# Patient Record
Sex: Female | Born: 1965 | Race: White | Hispanic: No | Marital: Married | Smoking: Never smoker
Health system: Southern US, Community
[De-identification: ages and names within clinical notes are randomized; demographics above are authoritative.]

## PROBLEM LIST (undated history)

## (undated) DIAGNOSIS — I10 Essential (primary) hypertension: Secondary | ICD-10-CM

## (undated) HISTORY — PX: ANKLE SURGERY: SHX546

## (undated) HISTORY — PX: DILATION AND CURETTAGE OF UTERUS: SHX78

---

## 2003-11-22 ENCOUNTER — Other Ambulatory Visit: Payer: Self-pay

## 2009-09-07 ENCOUNTER — Ambulatory Visit: Payer: Self-pay | Admitting: Obstetrics and Gynecology

## 2009-10-17 ENCOUNTER — Ambulatory Visit: Payer: Self-pay | Admitting: Obstetrics and Gynecology

## 2010-11-23 ENCOUNTER — Emergency Department: Payer: Self-pay | Admitting: Unknown Physician Specialty

## 2012-01-10 ENCOUNTER — Emergency Department: Payer: Self-pay | Admitting: *Deleted

## 2012-01-10 ENCOUNTER — Ambulatory Visit: Payer: Self-pay | Admitting: Medical

## 2012-04-14 ENCOUNTER — Ambulatory Visit: Payer: Self-pay | Admitting: Internal Medicine

## 2012-04-16 LAB — HM MAMMOGRAPHY: HM Mammogram: NORMAL

## 2013-09-07 ENCOUNTER — Ambulatory Visit: Payer: Self-pay | Admitting: Family Medicine

## 2014-07-10 LAB — HM PAP SMEAR: HM Pap smear: NEGATIVE

## 2014-07-10 LAB — LIPID PANEL
Cholesterol: 201 mg/dL — AB (ref 0–200)
HDL: 59 mg/dL (ref 35–70)
LDL CALC: 114 mg/dL
TRIGLYCERIDES: 140 mg/dL (ref 40–160)

## 2014-07-10 LAB — TSH: TSH: 1.1 u[IU]/mL (ref <=5.90)

## 2014-12-17 ENCOUNTER — Emergency Department
Admission: EM | Admit: 2014-12-17 | Discharge: 2014-12-17 | Disposition: A | Discharge status: Home / Self Care | Payer: BLUE CROSS/BLUE SHIELD | Attending: Emergency Medicine | Admitting: Emergency Medicine

## 2014-12-17 ENCOUNTER — Emergency Department: Payer: BLUE CROSS/BLUE SHIELD

## 2014-12-17 ENCOUNTER — Other Ambulatory Visit: Payer: Self-pay

## 2014-12-17 ENCOUNTER — Encounter: Payer: Self-pay | Admitting: Emergency Medicine

## 2014-12-17 DIAGNOSIS — Z79899 Other long term (current) drug therapy: Secondary | ICD-10-CM | POA: Diagnosis not present

## 2014-12-17 DIAGNOSIS — M6281 Muscle weakness (generalized): Secondary | ICD-10-CM | POA: Diagnosis present

## 2014-12-17 DIAGNOSIS — R630 Anorexia: Secondary | ICD-10-CM | POA: Insufficient documentation

## 2014-12-17 DIAGNOSIS — I10 Essential (primary) hypertension: Secondary | ICD-10-CM | POA: Insufficient documentation

## 2014-12-17 DIAGNOSIS — R55 Syncope and collapse: Secondary | ICD-10-CM | POA: Insufficient documentation

## 2014-12-17 DIAGNOSIS — R110 Nausea: Secondary | ICD-10-CM | POA: Diagnosis not present

## 2014-12-17 HISTORY — DX: Essential (primary) hypertension: I10

## 2014-12-17 LAB — CBC
HEMATOCRIT: 40.9 % (ref 35.0–47.0)
HEMOGLOBIN: 13.4 g/dL (ref 12.0–16.0)
MCH: 31.1 pg (ref 26.0–34.0)
MCHC: 32.8 g/dL (ref 32.0–36.0)
MCV: 94.7 fL (ref 80.0–100.0)
PLATELETS: 291 10*3/uL (ref 150–440)
RBC: 4.31 MIL/uL (ref 3.80–5.20)
RDW: 13.9 % (ref 11.5–14.5)
WBC: 7.9 10*3/uL (ref 3.6–11.0)

## 2014-12-17 LAB — URINALYSIS COMPLETE WITH MICROSCOPIC (ARMC ONLY)
Bacteria, UA: NONE SEEN
Bilirubin Urine: NEGATIVE
GLUCOSE, UA: NEGATIVE mg/dL
HGB URINE DIPSTICK: NEGATIVE
KETONES UR: NEGATIVE mg/dL
Leukocytes, UA: NEGATIVE
NITRITE: NEGATIVE
PROTEIN: NEGATIVE mg/dL
Specific Gravity, Urine: 1.005 (ref 1.005–1.030)
pH: 7 (ref 5.0–8.0)

## 2014-12-17 LAB — BASIC METABOLIC PANEL
Anion gap: 7 (ref 5–15)
BUN: 18 mg/dL (ref 6–20)
CO2: 25 mmol/L (ref 22–32)
Calcium: 8.8 mg/dL — ABNORMAL LOW (ref 8.9–10.3)
Chloride: 106 mmol/L (ref 101–111)
Creatinine, Ser: 0.74 mg/dL (ref 0.44–1.00)
Glucose, Bld: 100 mg/dL — ABNORMAL HIGH (ref 65–99)
POTASSIUM: 3.4 mmol/L — AB (ref 3.5–5.1)
Sodium: 138 mmol/L (ref 135–145)

## 2014-12-17 LAB — TROPONIN I: Troponin I: <0.03 ng/mL (ref <0.031)

## 2014-12-17 NOTE — ED Notes (Signed)
Patient states she has no pain but has numbness and tingling in the left arm.

## 2014-12-17 NOTE — ED Notes (Signed)
HR listed incorrect.  Patient maintaining normal HR.

## 2014-12-17 NOTE — ED Notes (Signed)
MD at bedside during triage

## 2014-12-17 NOTE — ED Provider Notes (Signed)
[facility] Emergency Department Provider Note   ____________________________________________  Time seen: 9:15 AM I have reviewed the triage vital signs and the triage nursing note.  HISTORY  Chief Complaint Weakness   Historian Patient  HPI Syndey Holder is a 49 y.o. female who felt generalized weakness this morning and wash was driving started to fell lightheaded and dizzy like she might pass out. She had no palpitations, no chest pain. She was nauseated and sweating at that episode. She feels somewhat better upon arrival here to the emergency department. She does still feel generalized weakness. Symptoms are considered moderate. She's never felt like this before. She does take losartan for blood pressure. She's not been sick or ill recently.    Past Medical History  Diagnosis Date  . Hypertension     There are no active problems to display for this patient.   Past Surgical History  Procedure Laterality Date  . Ankle surgery    . Dilation and curettage of uterus      Current Outpatient Rx  Name  Route  Sig  Dispense  Refill  . acetaminophen (TYLENOL) 500 MG tablet   Oral   Take 1,000 mg by mouth every 6 (six) hours as needed for mild pain, moderate pain or headache.         . albuterol (PROVENTIL HFA;VENTOLIN HFA) 108 (90 BASE) MCG/ACT inhaler   Inhalation   Inhale 2 puffs into the lungs every 6 (six) hours as needed for wheezing or shortness of breath.         Marland Kitchen aspirin-acetaminophen-caffeine (EXCEDRIN MIGRAINE) 250-250-65 MG per tablet   Oral   Take 2 tablets by mouth every 6 (six) hours as needed for headache or migraine.         Marland Kitchen losartan (COZAAR) 100 MG tablet   Oral   Take 100 mg by mouth daily.      12     Allergies Review of patient's allergies indicates no known allergies.  No family history on file. history of "heart problems "  Social History History  Substance Use Topics  . Smoking status: Never Smoker   .  Smokeless tobacco: Not on file  . Alcohol Use: No    Review of Systems  Constitutional: Negative for fever. Eyes: Negative for visual changes. ENT: Negative for sore throat. Cardiovascular: Negative for chest pain. Respiratory: Negative for shortness of breath. Gastrointestinal: Negative for abdominal pain, vomiting and diarrhea. Genitourinary: Negative for dysuria. Musculoskeletal: Negative for back pain. Skin: Negative for rash. Neurological: Negative for headaches, focal weakness or numbness.  ____________________________________________   PHYSICAL EXAM:  VITAL SIGNS: ED Triage Vitals  Enc Vitals Group     BP 12/17/14 0833 175/89 mmHg     Pulse Rate 12/17/14 0833 66     Resp 12/17/14 0833 16     Temp 12/17/14 0833 97.9 F (36.6 C)     Temp Source 12/17/14 0833 Oral     SpO2 12/17/14 0833 99 %     Weight 12/17/14 0833 215 lb (97.523 kg)     Height 12/17/14 0833 5\' 5"  (1.651 m)     Head Cir --      Peak Flow --      Pain Score --      Pain Loc --      Pain Edu? --      Excl. in GC? --      Constitutional: Alert and oriented. Slightly pale. Well appearing and in no distress. Eyes:  Conjunctivae are normal. PERRL. Normal extraocular movements. ENT   Head: Normocephalic and atraumatic.   Nose: No congestion/rhinnorhea.   Mouth/Throat: Mucous membranes are moist.   Neck: No stridor. Cardiovascular: Normal rate, regular rhythm.  No murmurs, rubs, or gallops. Respiratory: Normal respiratory effort without tachypnea nor retractions. Breath sounds are clear and equal bilaterally. No wheezes/rales/rhonchi. Gastrointestinal: Soft. No distention, no guarding, no rebound. Nontender  Genitourinary/rectal: Deferred Musculoskeletal: Nontender with normal range of motion in all extremities. No joint effusions.  No lower extremity tenderness nor edema. Neurologic:  Normal speech and language. No gross focal neurologic deficits are appreciated. Skin:  Skin is warm,  dry and intact. No rash noted. Psychiatric: Mood and affect are normal. Speech and behavior are normal. Patient exhibits appropriate insight and judgment.  ____________________________________________   EKG  I, Governor Rooks, MD, the attending physician have personally viewed and interpreted this ECG.   67 bpm. Normal sinus rhythm. Narrow QRS. Normal axis. Nonspecific T wave. ____________________________________________  LABS (pertinent positives/negatives)  White blood cell count normal at 7.9 and hemoglobin 13.4 Metabolic panel without significant abnormality Troponin less than 0.03 Urinalysis negative  ____________________________________________  RADIOLOGY Radiologist results reviewed  Chest x-ray: Negative __________________________________________  PROCEDURES  Procedure(s) performed: None Critical Care performed: None  ____________________________________________   ED COURSE / ASSESSMENT AND PLAN  Pertinent labs & imaging results that were available during my care of the patient were reviewed by me and considered in my medical decision making (see chart for details).  Patient an episode of nausea, dizziness, sweating which sounds like a presyncopal event. There was no chest pain or palpitations. She's not been sick or ill recently. She has not had return of any symptoms. Her EKG and evaluation are reassuring. I suspect this may have been a possible on vasovagal presyncopal episode. I discussed with them that this is what I think is going on and that her evaluation is reassuring. After repeat troponin to 3 hour, negative, patient is discharged with follow-up instructions and return precautions. I discussed with her following up with the cardiologist for the consideration of echocardiogram and or Holter monitor.   ___________________________________________   FINAL CLINICAL IMPRESSION(S) / ED DIAGNOSES   Final diagnoses:  Near syncope      Governor Rooks,  MD 12/17/14 1114

## 2014-12-17 NOTE — ED Notes (Addendum)
Patient came in by EMS- c/o waking up light headed and dizzy and felt like she was going to pass out.  When she got to work she called EMS.  Per EMS Patient hypertensive the entire drive over.  CBG 90.

## 2014-12-17 NOTE — Discharge Instructions (Signed)
No certain cause was found for your episode of near passing out, but her exam and evaluation are reassuring. Return to the emergency department for any new or worsening condition including chest pain, passing out, trouble breathing, one-sided weakness or numbness, trouble with speech, fever, or any other symptoms concerning to you. We discussed follow-up with a physician within 2 days, either cardiologist or your primary care doctor.  Near-Syncope Near-syncope (commonly known as near fainting) is sudden weakness, dizziness, or feeling like you might pass out. During an episode of near-syncope, you may also develop pale skin, have tunnel vision, or feel sick to your stomach (nauseous). Near-syncope may occur when getting up after sitting or while standing for a long time. It is caused by a sudden decrease in blood flow to the brain. This decrease can result from various causes or triggers, most of which are not serious. However, because near-syncope can sometimes be a sign of something serious, a medical evaluation is required. The specific cause is often not determined. HOME CARE INSTRUCTIONS  Monitor your condition for any changes. The following actions may help to alleviate any discomfort you are experiencing:  Have someone stay with you until you feel stable.  Lie down right away and prop your feet up if you start feeling like you might faint. Breathe deeply and steadily. Wait until all the symptoms have passed. Most of these episodes last only a few minutes. You may feel tired for several hours.   Drink enough fluids to keep your urine clear or pale yellow.   If you are taking blood pressure or heart medicine, get up slowly when seated or lying down. Take several minutes to sit and then stand. This can reduce dizziness.  Follow up with your health care provider as directed. SEEK IMMEDIATE MEDICAL CARE IF:   You have a severe headache.   You have unusual pain in the chest, abdomen, or  back.   You are bleeding from the mouth or rectum, or you have black or tarry stool.   You have an irregular or very fast heartbeat.   You have repeated fainting or have seizure-like jerking during an episode.   You faint when sitting or lying down.   You have confusion.   You have difficulty walking.   You have severe weakness.   You have vision problems.  MAKE SURE YOU:   Understand these instructions.  Will watch your condition.  Will get help right away if you are not doing well or get worse. Document Released: 06/23/2005 Document Revised: 06/28/2013 Document Reviewed: 11/26/2012 [facility] Patient Information 2015 [place], [place]. This information is not intended to replace advice given to you by your health care provider. Make sure you discuss any questions you have with your health care provider.

## 2015-05-02 ENCOUNTER — Encounter: Payer: Self-pay | Admitting: Internal Medicine

## 2015-05-02 ENCOUNTER — Other Ambulatory Visit: Payer: Self-pay | Admitting: Internal Medicine

## 2015-05-02 DIAGNOSIS — G43019 Migraine without aura, intractable, without status migrainosus: Secondary | ICD-10-CM | POA: Insufficient documentation

## 2015-05-02 DIAGNOSIS — I10 Essential (primary) hypertension: Secondary | ICD-10-CM | POA: Insufficient documentation

## 2015-05-02 DIAGNOSIS — E782 Mixed hyperlipidemia: Secondary | ICD-10-CM | POA: Insufficient documentation

## 2015-05-02 DIAGNOSIS — J4520 Mild intermittent asthma, uncomplicated: Secondary | ICD-10-CM | POA: Insufficient documentation

## 2015-05-02 DIAGNOSIS — M5412 Radiculopathy, cervical region: Secondary | ICD-10-CM | POA: Insufficient documentation

## 2015-05-02 DIAGNOSIS — H9319 Tinnitus, unspecified ear: Secondary | ICD-10-CM | POA: Insufficient documentation

## 2015-05-02 DIAGNOSIS — R87610 Atypical squamous cells of undetermined significance on cytologic smear of cervix (ASC-US): Secondary | ICD-10-CM | POA: Insufficient documentation

## 2015-11-16 ENCOUNTER — Other Ambulatory Visit: Payer: Self-pay | Admitting: Internal Medicine

## 2015-12-05 NOTE — Telephone Encounter (Signed)
Left for pt to call the office pt still has not return any of my calls.

## 2016-06-09 ENCOUNTER — Other Ambulatory Visit: Payer: Self-pay | Admitting: Pediatrics

## 2016-06-09 DIAGNOSIS — Z1231 Encounter for screening mammogram for malignant neoplasm of breast: Secondary | ICD-10-CM

## 2016-07-08 ENCOUNTER — Ambulatory Visit
Admission: RE | Admit: 2016-07-08 | Discharge: 2016-07-08 | Disposition: A | Discharge status: Home / Self Care | Payer: BLUE CROSS/BLUE SHIELD | Source: Ambulatory Visit | Attending: Pediatrics | Admitting: Pediatrics

## 2016-07-08 DIAGNOSIS — Z1231 Encounter for screening mammogram for malignant neoplasm of breast: Secondary | ICD-10-CM | POA: Insufficient documentation

## 2016-07-14 ENCOUNTER — Other Ambulatory Visit: Payer: Self-pay | Admitting: Pediatrics

## 2016-07-14 DIAGNOSIS — R921 Mammographic calcification found on diagnostic imaging of breast: Secondary | ICD-10-CM

## 2016-07-14 DIAGNOSIS — R928 Other abnormal and inconclusive findings on diagnostic imaging of breast: Secondary | ICD-10-CM

## 2016-07-30 ENCOUNTER — Other Ambulatory Visit: Payer: Self-pay | Admitting: Pediatrics

## 2016-07-30 ENCOUNTER — Ambulatory Visit
Admission: RE | Admit: 2016-07-30 | Discharge: 2016-07-30 | Disposition: A | Discharge status: Home / Self Care | Payer: BLUE CROSS/BLUE SHIELD | Source: Ambulatory Visit | Attending: Pediatrics | Admitting: Pediatrics

## 2016-07-30 DIAGNOSIS — R928 Other abnormal and inconclusive findings on diagnostic imaging of breast: Secondary | ICD-10-CM

## 2016-07-30 DIAGNOSIS — R921 Mammographic calcification found on diagnostic imaging of breast: Secondary | ICD-10-CM | POA: Insufficient documentation

## 2016-08-05 ENCOUNTER — Other Ambulatory Visit: Payer: Self-pay | Admitting: Pediatrics

## 2016-08-05 DIAGNOSIS — R921 Mammographic calcification found on diagnostic imaging of breast: Secondary | ICD-10-CM

## 2016-08-05 DIAGNOSIS — R928 Other abnormal and inconclusive findings on diagnostic imaging of breast: Secondary | ICD-10-CM

## 2016-08-27 ENCOUNTER — Ambulatory Visit
Admission: RE | Admit: 2016-08-27 | Discharge: 2016-08-27 | Disposition: A | Discharge status: Home / Self Care | Payer: BLUE CROSS/BLUE SHIELD | Source: Ambulatory Visit | Attending: Pediatrics | Admitting: Pediatrics

## 2016-08-27 DIAGNOSIS — R928 Other abnormal and inconclusive findings on diagnostic imaging of breast: Secondary | ICD-10-CM

## 2016-08-27 DIAGNOSIS — R921 Mammographic calcification found on diagnostic imaging of breast: Secondary | ICD-10-CM

## 2017-01-04 ENCOUNTER — Ambulatory Visit
Admission: EM | Admit: 2017-01-04 | Discharge: 2017-01-04 | Disposition: A | Discharge status: Home / Self Care | Payer: Worker's Compensation | Attending: Family Medicine | Admitting: Family Medicine

## 2017-01-04 DIAGNOSIS — S63642A Sprain of metacarpophalangeal joint of left thumb, initial encounter: Secondary | ICD-10-CM | POA: Diagnosis not present

## 2017-01-04 MED ORDER — IBUPROFEN 600 MG PO TABS: 600.0000 mg | ORAL_TABLET | Freq: Four times a day (QID) | ORAL | 0 refills | Status: AC | PRN

## 2017-01-04 NOTE — ED Triage Notes (Signed)
Patient complains of left hand pain that started yesterday after trying to turn over a bucket of sheet rock mud. Patient states that she felt a pop at the base of her thumb and has noticed pain and swelling. Patient states that this occurred around 3pm yesterday. Patient noticed today she was unable to grasp a dollar bill.

## 2017-01-04 NOTE — ED Provider Notes (Signed)
CSN: [id number]     Arrival date & time 01/04/17  1426 History   First MD Initiated Contact with Patient 01/04/17 1504     Chief Complaint  Patient presents with  . Work Related Injury  . Hand Injury    Left    (Consider location/radiation/quality/duration/timing/severity/associated sxs/prior Treatment) HPI  51 year old female presents to the clinic for evaluation of a Worker's Comp. injury. Patient works at [employer] home [employer]. She is right-hand dominant. She states yesterday, she was working at [employer] when she was twisting a bucket of sheet rock putty with the handle of the bucket in her hand. As she was rotating the bucket, she felt a pull to the thenar eminence of her left thumb. She denies hearing or feeling a pop. Her pain was immediately present, sharp, moderate. Pain has improved now, currently a 6/10. She has taken some Tylenol. She denies any catching triggering her locking of the left thumb. No numbness or tingling.  Past Medical History:  Diagnosis Date  . Hypertension    Past Surgical History:  Procedure Laterality Date  . ANKLE SURGERY    . DILATION AND CURETTAGE OF UTERUS     Family History  Problem Relation Age of Onset  . Adopted: Yes  . Breast cancer Neg Hx    Social History  Substance Use Topics  . Smoking status: Never Smoker  . Smokeless tobacco: Never Used  . Alcohol use No   OB History    No data available     Review of Systems  Constitutional: Negative for fever.  Musculoskeletal: Positive for arthralgias. Negative for gait problem, joint swelling, myalgias and neck stiffness.  Neurological: Negative for weakness and numbness.    Allergies  Patient has no known allergies.  Home Medications   Prior to Admission medications   Medication Sig Start Date End Date Taking? Authorizing Provider  acetaminophen (TYLENOL) 500 MG tablet Take 1,000 mg by mouth every 6 (six) hours as needed for mild pain, moderate pain or headache.   Yes  [provider]  albuterol (PROVENTIL HFA;VENTOLIN HFA) 108 (90 BASE) MCG/ACT inhaler Inhale 2 puffs into the lungs every 6 (six) hours as needed for wheezing or shortness of breath.   Yes [provider]  aspirin 81 MG tablet Take 1 tablet by mouth daily.   Yes [provider]  aspirin-acetaminophen-caffeine (EXCEDRIN MIGRAINE) [phone] MG per tablet Take 2 tablets by mouth every 6 (six) hours as needed for headache or migraine.   Yes [provider]  losartan (COZAAR) 100 MG tablet TAKE 1 TABLET BY MOUTH DAILY. 11/16/15  Yes Reubin Milan, MD  ibuprofen (ADVIL,MOTRIN) 600 MG tablet Take 1 tablet (600 mg total) by mouth every 6 (six) hours as needed for moderate pain. 01/04/17   Evon Slack, PA-C   Meds Ordered and Administered this Visit  Medications - No data to display  BP (!) 153/84 (BP Location: Left Arm)   Pulse 81   Temp 98.2 F (36.8 C) (Oral)   Resp 16   Ht 5\' 4"  (1.626 m)   Wt 215 lb (97.5 kg)   SpO2 100%   BMI 36.90 kg/m  No data found.   Physical Exam  Constitutional: She is oriented to person, place, and time. She appears well-developed and well-nourished.  HENT:  Head: Normocephalic and atraumatic.  Eyes: Conjunctivae are normal.  Neck: Normal range of motion.  Cardiovascular: Normal rate.   Musculoskeletal:  Examination of the left hand shows patient  has full composite fist. She is minimally tender to palpation along the thenar eminence of the left thumb. Strength is within normal limits, no weakness with opposition. She has a slightly positive since he granted this. She is nontender along the lumbar pulling with no catching triggering locking with active or passive range of motion of the left thumb. Thumb is stable to valgus and varus stress testing along the collateral ligaments. No pain with stress testing. She has a minimally positive Finkelstein's test but no significant tenderness over the radial styloid. There is no  swelling warmth or erythema noted. Sensation is intact distally. Negative Tinel's and Phalen's sign.  Neurological: She is alert and oriented to person, place, and time.    Urgent Care Course     Procedures (including critical care time)   Labs Review Labs Reviewed - No data to display  Imaging Review No results found.   Visual Acuity Review  Right Eye Distance:   Left Eye Distance:   Bilateral Distance:    Right Eye Near:   Left Eye Near:    Bilateral Near:         MDM   1. Sprain of metacarpophalangeal (MCP) joint of left thumb, initial encounter    51 year old female with mild strain to the left thumb thenar muscle. 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 No evidence of dislocation/fracture/deformity. She is placed into thumb spica splint. She'll start ibuprofen 600 mg 3 times a day with food. She can return to work with light duty restrictions consisting of no pushing pulling or lifting greater than 20 pounds. Left upper extremity, muscle or splint were. She'll follow-up with orthopedics.   Evon SlackGaines, Thomas C, PA-C 01/04/17 1524

## 2017-01-04 NOTE — Discharge Instructions (Signed)
Please take ibuprofen and Tylenol as needed for pain. Wear thumb spica splint for the next week until you can follow-up with orthopedics. Avoid any lifting pushing or pulling with the left hand.

## 2018-06-27 IMAGING — MG MM DIGITAL DIAGNOSTIC UNILAT*R* W/ TOMO W/ CAD
8 of 10 series · 8 of 18 positions shown · non-contrast
Comparison: Previous exam(s).

ADDENDUM:
The patient returned for attempt at stereotactic guided biopsy of
the calcifications seen within the right breast on recent diagnostic
mammogram. On the CC scout tomosynthesis view obtained for
targeting, the calcifications are seen to be definitively within a
blood vessel wall consistent with vascular calcifications. No
further workup is needed. The patient can return to annual screening
mammography in July 2017.
CLINICAL DATA: Callback from screening mammogram

EXAM:
2D DIGITAL DIAGNOSTIC UNILATERAL RIGHT MAMMOGRAM WITH CAD AND
ADJUNCT TOMO

[R ML (1 of 4)]
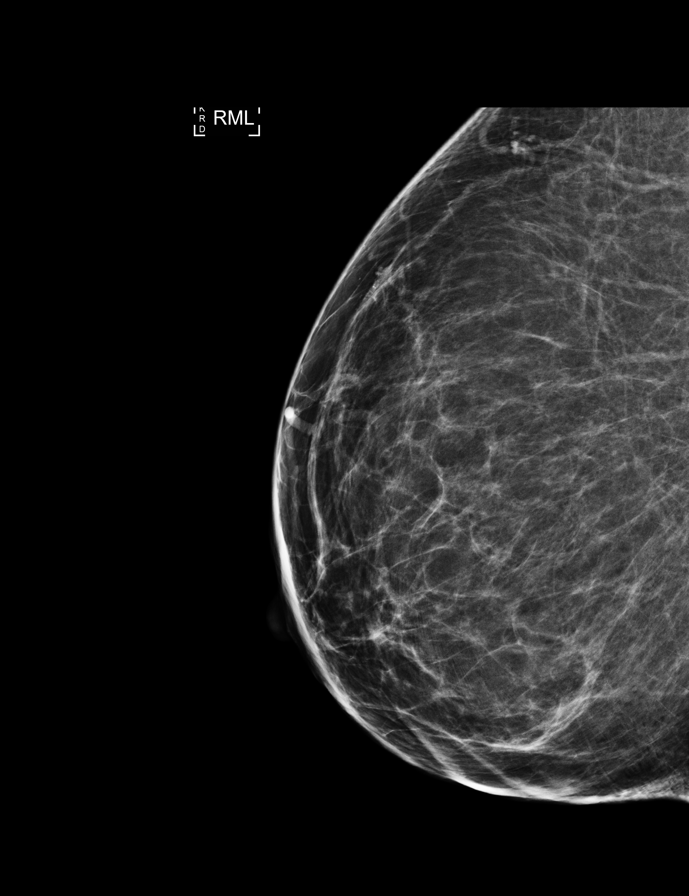

[R CC (1 of 2)]
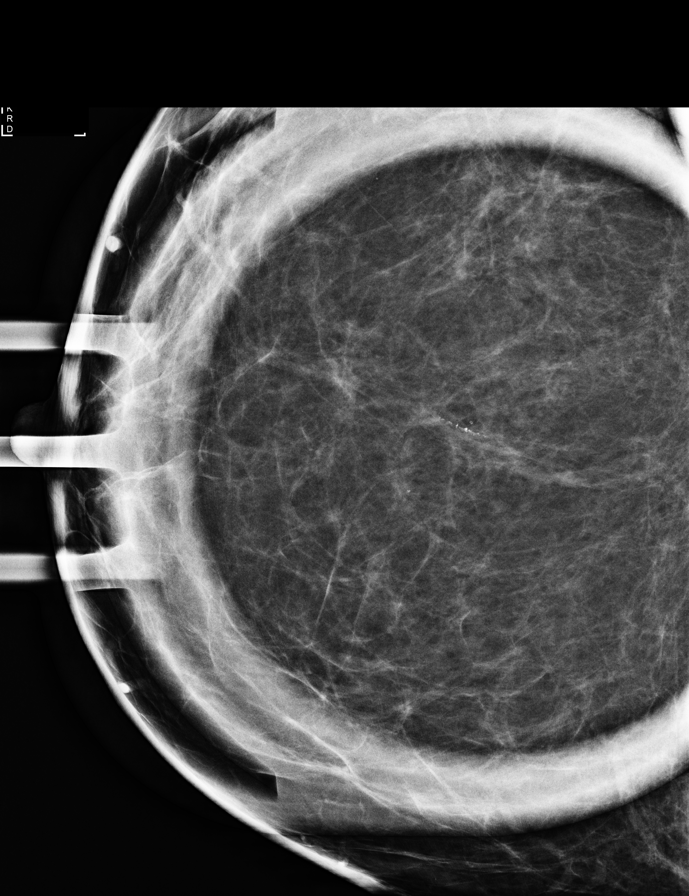

[R ML (2 of 4)]
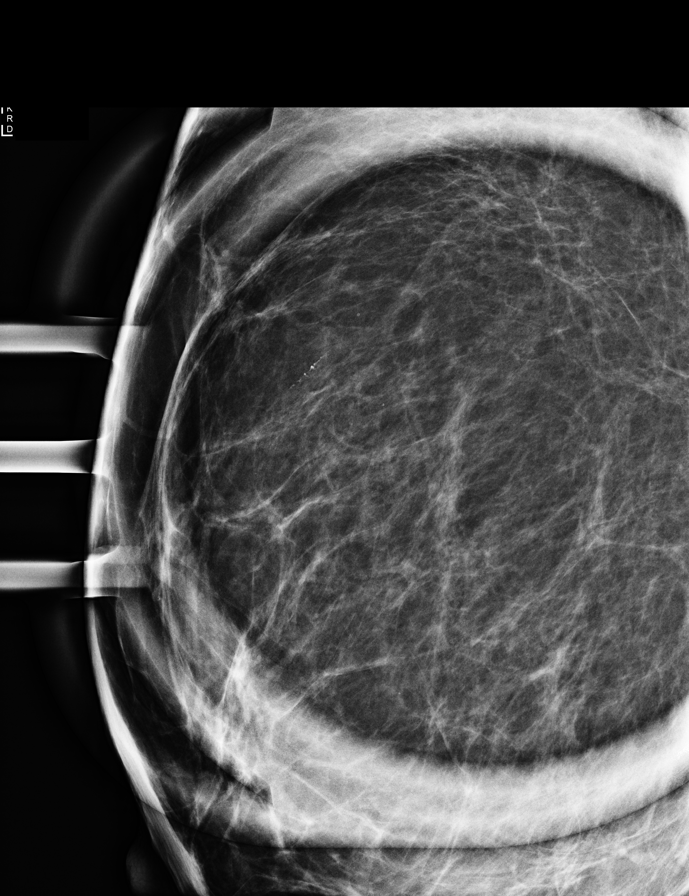

[R ML (3 of 4)]
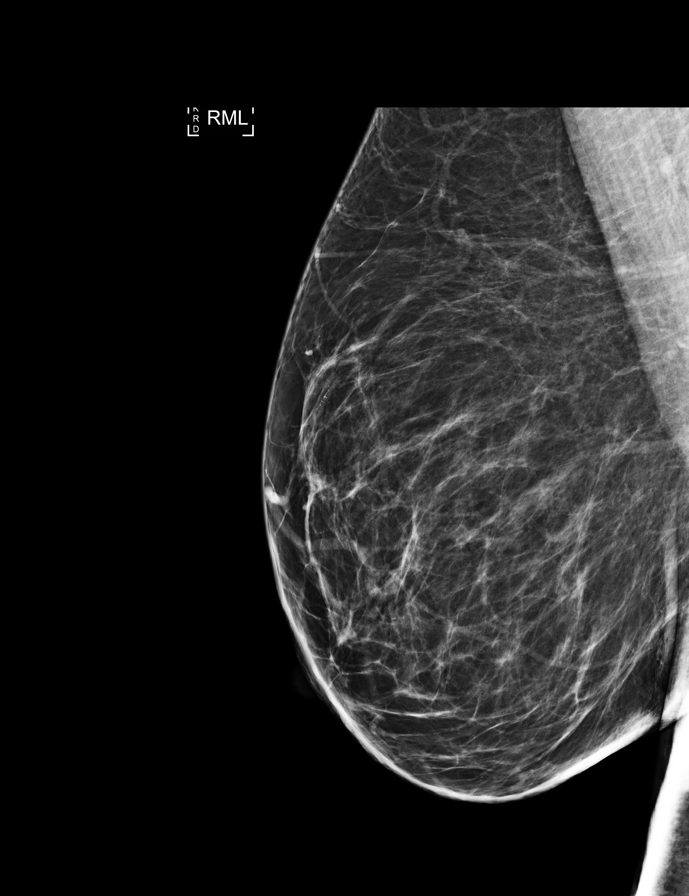

[R ML (4 of 4)]
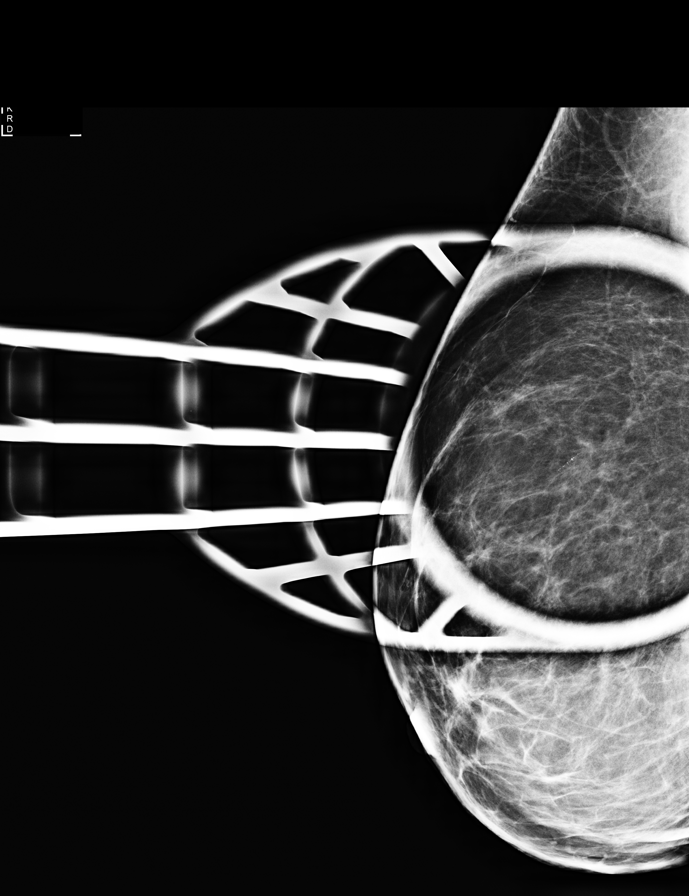

[R CC synth-2D]
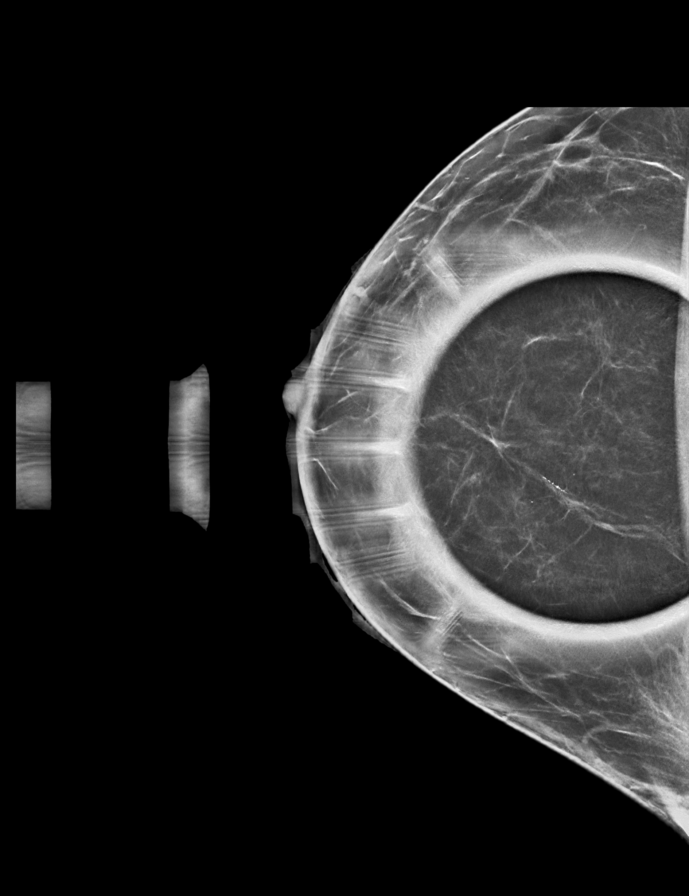

[R ML synth-2D]
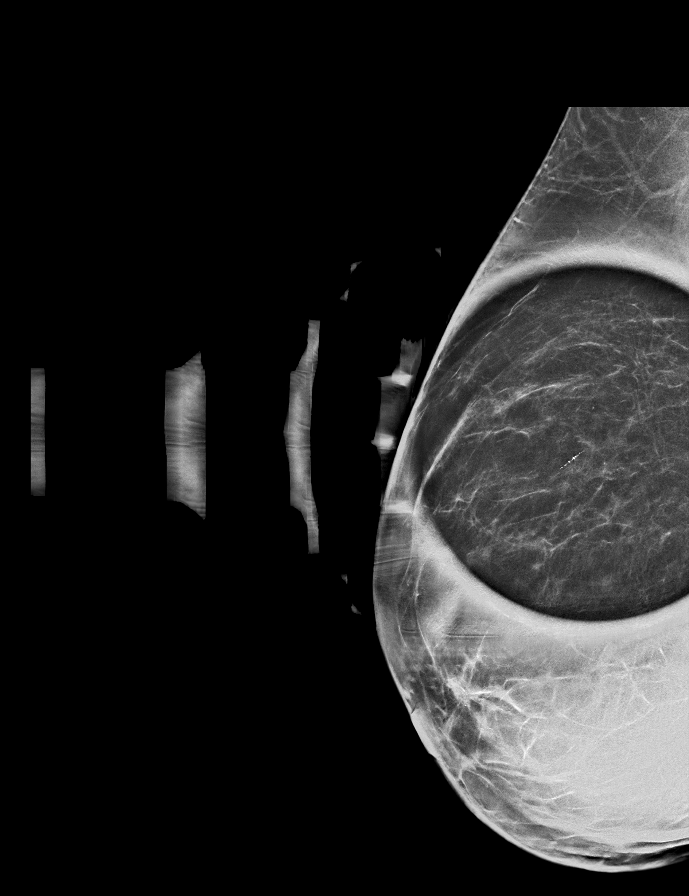

[R CC (2 of 2)]
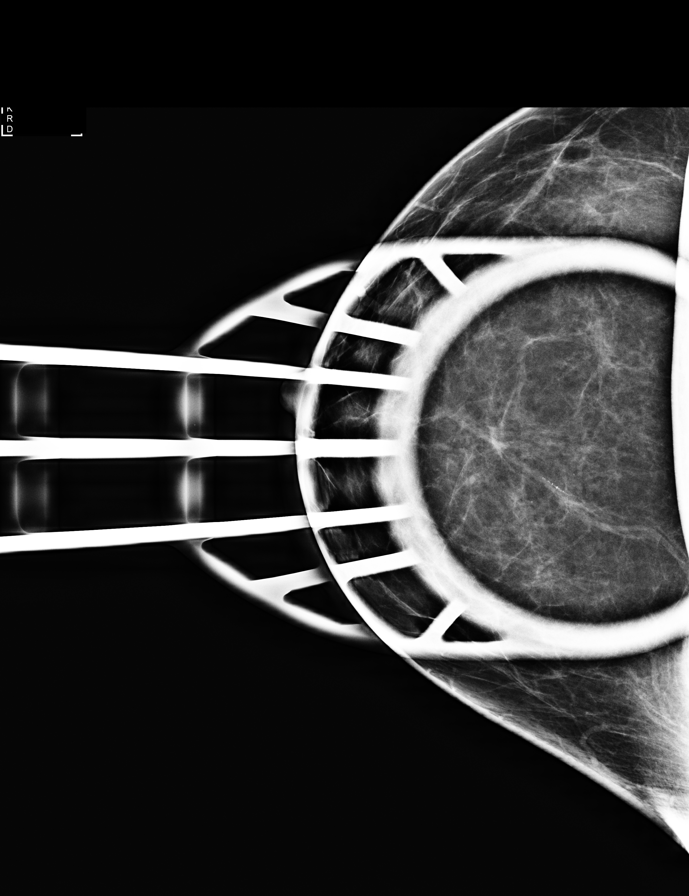

[8 of 18 positions shown; findings below may reference images not displayed]

IMPRESSION: No mammographic evidence of malignancy.

RECOMMENDATION:  Bilateral screening mammogram in July 2017.

BI-RADS 2:  Benign.
ACR Breast Density Category b: There are scattered areas of
fibroglandular density.
FINDINGS: Additional views demonstrate pleomorphic calcifications in a linear
distribution spanning 8 mm. Additional spot-compression
tomosynthesis views were performed in attempt to determine if the
calcifications are associated with a vessel. On the CC spot
compression view with tomosynthesis, it is difficult to determine
with certainty if these calcifications are within the blood vessel
wall or just adjacent to the blood vessel.

Mammographic images were processed with CAD.
IMPRESSION: Indeterminate right breast calcifications.

RECOMMENDATION:
Tomosynthesis guided right breast biopsy. If at the time of
attempted biopsy, the calcifications are shown to be definitively
vascular, the biopsy can be canceled and the patient returned to
screening. This was discussed with the patient at the time of biopsy
recommendation.

I have discussed the findings and recommendations with the patient.
Results were also provided in writing at the conclusion of the
visit. If applicable, a reminder letter will be sent to the patient
regarding the next appointment.

BI-RADS CATEGORY  4: Suspicious.
